# Patient Record
Sex: Male | Born: 1985 | Race: Black or African American | Hispanic: No | Marital: Single | Smoking: Current every day smoker
Health system: Southern US, Community
[De-identification: ages and names within clinical notes are randomized; demographics above are authoritative.]

---

## 2005-05-19 ENCOUNTER — Emergency Department: Payer: Self-pay | Admitting: Emergency Medicine

## 2006-03-25 ENCOUNTER — Emergency Department: Payer: Self-pay | Admitting: Emergency Medicine

## 2016-09-30 ENCOUNTER — Emergency Department
Admission: EM | Admit: 2016-09-30 | Discharge: 2016-09-30 | Disposition: A | Discharge status: Home / Self Care | Payer: 59 | Attending: Emergency Medicine | Admitting: Emergency Medicine

## 2016-09-30 ENCOUNTER — Encounter: Payer: Self-pay | Admitting: Emergency Medicine

## 2016-09-30 DIAGNOSIS — H6690 Otitis media, unspecified, unspecified ear: Secondary | ICD-10-CM | POA: Diagnosis not present

## 2016-09-30 DIAGNOSIS — F17200 Nicotine dependence, unspecified, uncomplicated: Secondary | ICD-10-CM | POA: Diagnosis not present

## 2016-09-30 DIAGNOSIS — H9201 Otalgia, right ear: Secondary | ICD-10-CM | POA: Diagnosis present

## 2016-09-30 MED ORDER — IBUPROFEN 400 MG PO TABS
ORAL_TABLET | ORAL | Status: AC
  Administered 2016-09-30: 400 mg via ORAL
  Filled 2016-09-30: qty 1

## 2016-09-30 MED ORDER — AMOXICILLIN-POT CLAVULANATE 875-125 MG PO TABS: 1.0000 | ORAL_TABLET | Freq: Two times a day (BID) | ORAL | 0 refills | Status: AC

## 2016-09-30 MED ORDER — IBUPROFEN 400 MG PO TABS
400.0000 mg | ORAL_TABLET | Freq: Once | ORAL | Status: AC | PRN
  Administered 2016-09-30: 400 mg via ORAL

## 2016-09-30 NOTE — ED Notes (Signed)
Pt still co pain in right ear with a pain scale rating of 10 on a 1-10 scale. Pt VS updated and pt updated on waiting room status

## 2016-09-30 NOTE — ED Triage Notes (Signed)
Pt ambulatory to triage in NAD, report right ear pain starting yesterday, reports feeling fluid in ear.

## 2016-09-30 NOTE — ED Notes (Signed)
No answer when called for treatment room.  ?

## 2016-09-30 NOTE — ED Provider Notes (Signed)
[facility] Emergency Department Provider Note  Time seen: 5:56 AM  I have reviewed the triage vital signs and the nursing notes.   HISTORY  Chief Complaint Otalgia    HPI Marvin Brown is a 31 y.o. male with no past medical history who presents the emergency department right ear pain for the past 2 days. According to the patient to this that he developed mild right ear pain. He states he has to wear earplugs at work and thought it was related to the earplugs. He states overnight last night he was trying to sleep the pain kept getting worse and worse which she describes as a moderate pressure/aching feeling in his right ear. He came to the emergency department for evaluation. Denies any fluid leakage. Denies any fever. States moderate headache although much improved after receiving ibuprofen in the emergency department.  History reviewed. No pertinent past medical history.  There are no active problems to display for this patient.   History reviewed. No pertinent surgical history.  Prior to Admission medications   Not on File    No Known Allergies  History reviewed. No pertinent family history.  Social History Social History  Substance Use Topics  . Smoking status: Current Every Day Smoker  . Smokeless tobacco: Never Used  . Alcohol use No    Review of Systems Constitutional: Negative for fever. ENT: Negative for congestion. Positive for right ear pain. Negative for hearing loss. Cardiovascular: Negative for chest pain. Respiratory: Negative for shortness of breath. Gastrointestinal: Negative for abdominal pain Skin: Negative for rash. Neurological: Moderate headache, much improved at this time. All other ROS negative  ____________________________________________   PHYSICAL EXAM:  VITAL SIGNS: ED Triage Vitals  Enc Vitals Group     BP 09/30/16 0131 (!) 154/99     Pulse Rate 09/30/16 0131 72     Resp 09/30/16 0131 18     Temp 09/30/16  0131 98.5 F (36.9 C)     Temp Source 09/30/16 0131 Oral     SpO2 09/30/16 0131 99 %     Weight 09/30/16 0131 240 lb (108.9 kg)     Height 09/30/16 0131 5\' 8"  (1.727 m)     Head Circumference --      Peak Flow --      Pain Score 09/30/16 0136 10     Pain Loc --      Pain Edu? --      Excl. in GC? --     Constitutional: Alert and oriented. Well appearing and in no distress. Eyes: Normal exam ENT   Head: Normocephalic and atraumatic. The patient's right tympanic membrane is erythematous and somewhat bulging. Very normal-appearing left tympanic membrane.    Mouth/Throat: Mucous membranes are moist. Cardiovascular: Normal rate, regular rhythm. No murmur Respiratory: Normal respiratory effort without tachypnea nor retractions. Breath sounds are clear Gastrointestinal: Soft and nontender. No distention.   Musculoskeletal: Nontender with normal range of motion in all extremities. Neurologic:  Normal speech and language. No gross focal neurologic deficits  Skin:  Skin is warm, dry and intact.  Psychiatric: Mood and affect are normal.  ____________________________________________    INITIAL IMPRESSION / ASSESSMENT AND PLAN / ED COURSE  Pertinent labs & imaging results that were available during my care of the patient were reviewed by me and considered in my medical decision making (see chart for details).  Patient presents to the emergency department right ear pain for the past 2 days. Exam is most consistent with otitis  media. I discussed with the patient to avoid using earplugs for the next 2 days taking his antibiotic as prescribed and avoid using Q-tips. Patient agreeable.  ____________________________________________   FINAL CLINICAL IMPRESSION(S) / ED DIAGNOSES  Right otitis media    Minna Antis, MD 09/30/16 [phone]

## 2021-03-25 ENCOUNTER — Emergency Department: Payer: No Typology Code available for payment source

## 2021-03-25 ENCOUNTER — Other Ambulatory Visit: Payer: Self-pay

## 2021-03-25 ENCOUNTER — Emergency Department
Admission: EM | Admit: 2021-03-25 | Discharge: 2021-03-25 | Disposition: A | Discharge status: Home / Self Care | Payer: No Typology Code available for payment source | Attending: Emergency Medicine | Admitting: Emergency Medicine

## 2021-03-25 DIAGNOSIS — F17210 Nicotine dependence, cigarettes, uncomplicated: Secondary | ICD-10-CM | POA: Diagnosis not present

## 2021-03-25 DIAGNOSIS — S6992XA Unspecified injury of left wrist, hand and finger(s), initial encounter: Secondary | ICD-10-CM | POA: Insufficient documentation

## 2021-03-25 DIAGNOSIS — S0511XA Contusion of eyeball and orbital tissues, right eye, initial encounter: Secondary | ICD-10-CM | POA: Insufficient documentation

## 2021-03-25 MED ORDER — NAPROXEN 500 MG PO TABS
500.0000 mg | ORAL_TABLET | Freq: Once | ORAL | Status: AC
  Administered 2021-03-25: 500 mg via ORAL
  Filled 2021-03-25: qty 1

## 2021-03-25 MED ORDER — HYDROCODONE-ACETAMINOPHEN 5-325 MG PO TABS: 1.0000 | ORAL_TABLET | Freq: Four times a day (QID) | ORAL | 0 refills | Status: AC | PRN

## 2021-03-25 MED ORDER — NAPROXEN 500 MG PO TABS: 500.0000 mg | ORAL_TABLET | Freq: Two times a day (BID) | ORAL | 0 refills | Status: AC
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
## 2021-03-25 NOTE — Discharge Instructions (Signed)
Please wear the brace until you see the orthopedic specialist. Do not drive or use machinery if taking the Norco.
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
## 2021-03-25 NOTE — ED Notes (Signed)
Patient transported to X-ray 
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
